# Patient Record
Sex: Female | Born: 2000 | Hispanic: Yes | Marital: Single | Smoking: Never smoker
Health system: Southern US, Community
[De-identification: ages and names within clinical notes are randomized; demographics above are authoritative.]

---
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
## 2021-03-03 IMAGING — US US OB < 14 WEEKS - US OB TV
1 series · 13 of 28 positions shown · non-contrast
Comparison: None.

CLINICAL DATA: Vaginal bleeding, positive urine pregnancy test

EXAM:
OBSTETRIC <14 WK US AND TRANSVAGINAL OB US
TECHNIQUE: Both transabdominal and transvaginal ultrasound examinations were
performed for complete evaluation of the gestation as well as the
maternal uterus, adnexal regions, and pelvic cul-de-sac.
Transvaginal technique was performed to assess early pregnancy.

[Series 1: us ob less than 14 weeks with ob transvaginal · 13 of 135 slices shown]
[im 5/135]
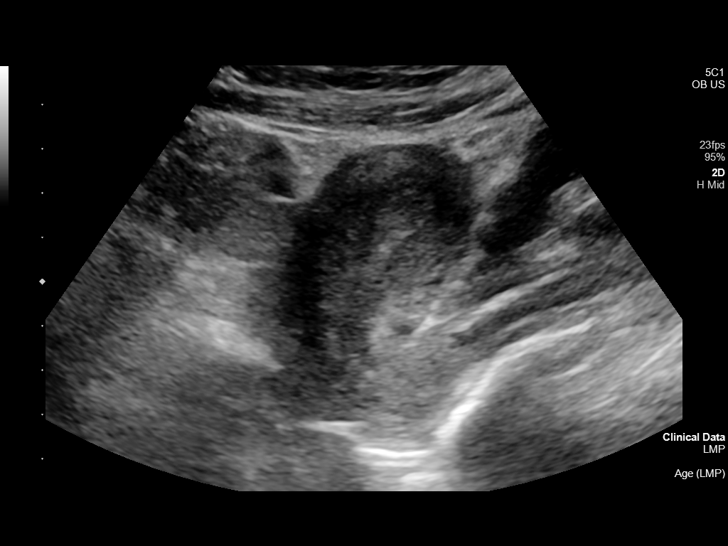
[im 15/135]
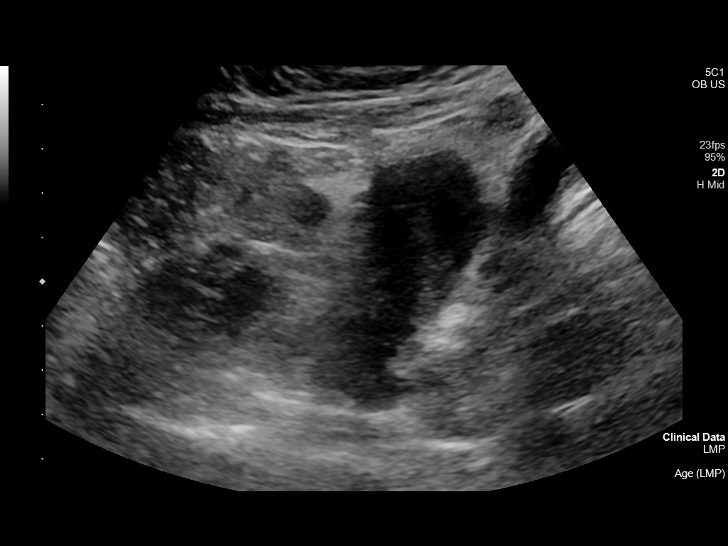
[im 25/135]
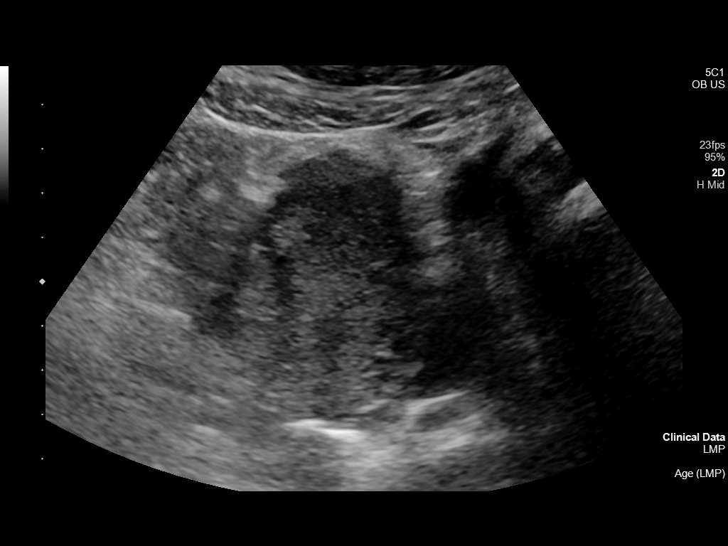
[im 35/135]
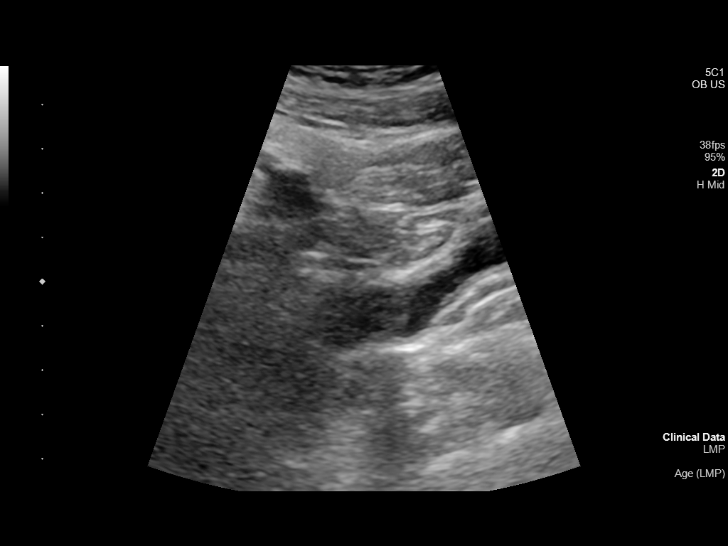
[im 45/135]
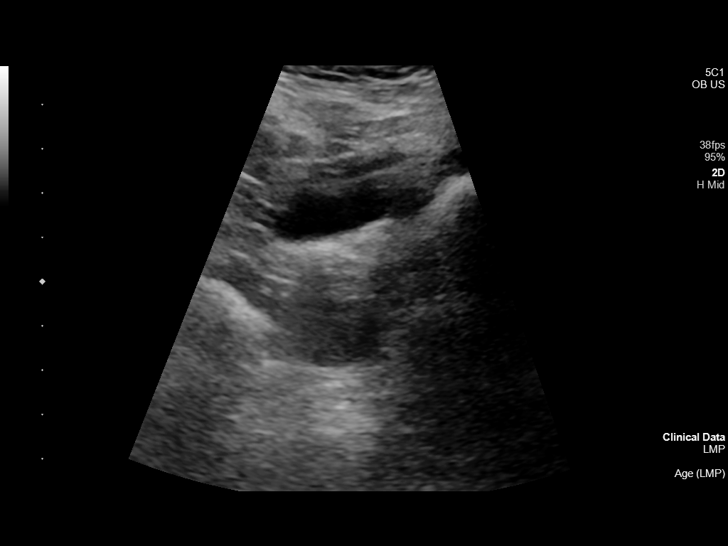
[im 55/135]
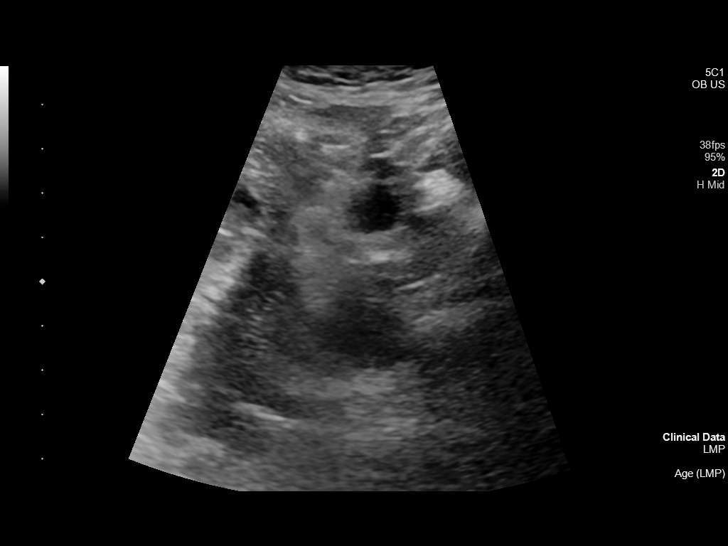
[im 70/135]
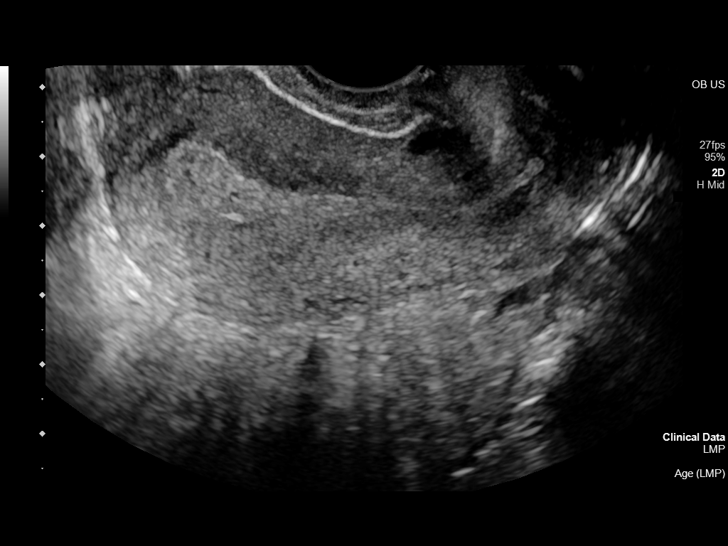
[im 80/135]
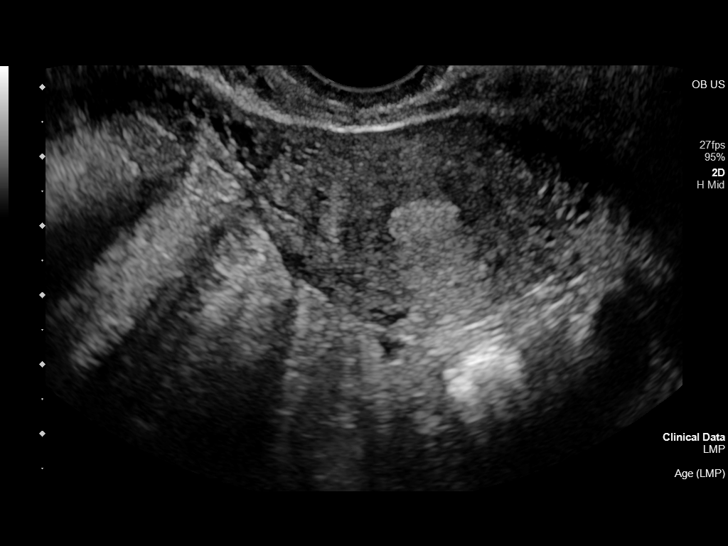
[im 90/135]
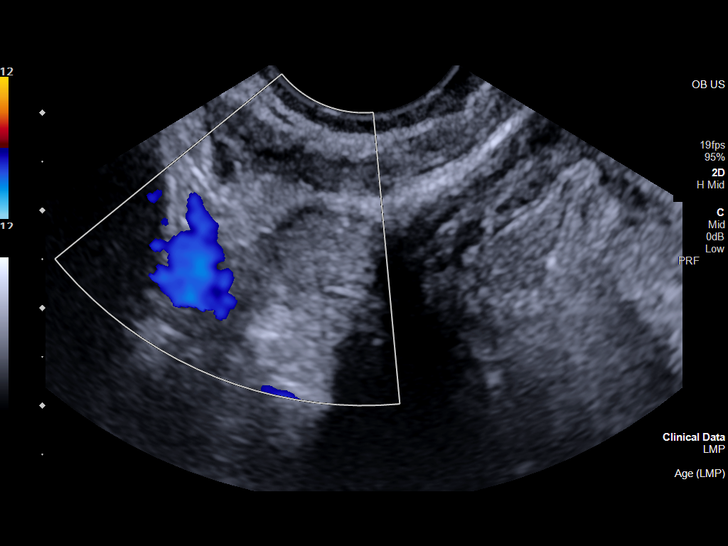
[im 100/135]
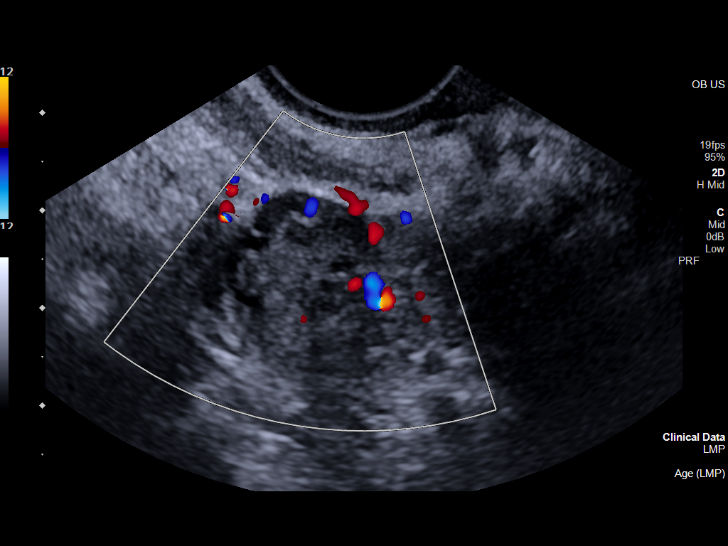
[im 110/135]
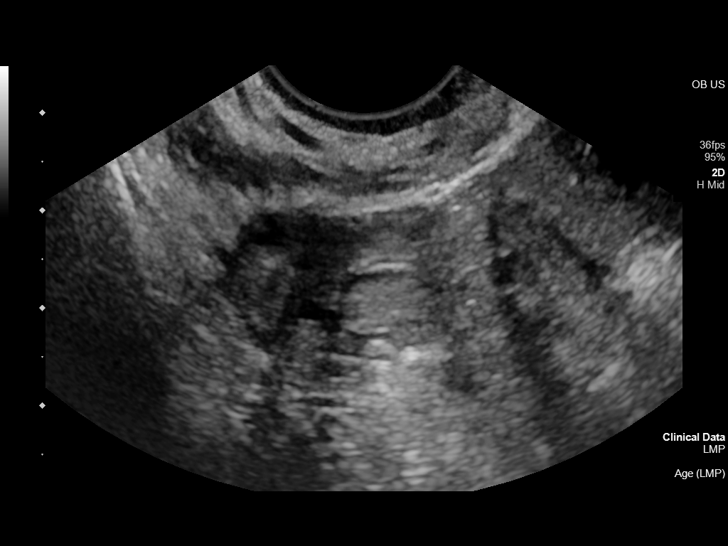
[im 120/135]
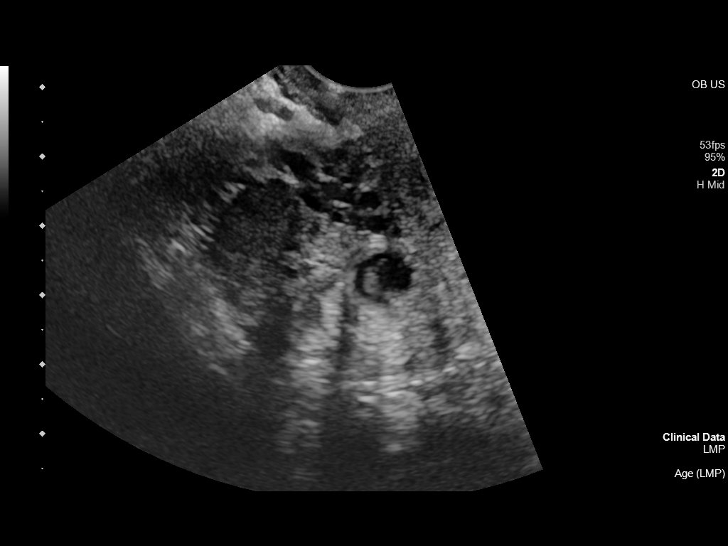
[im 130/135]
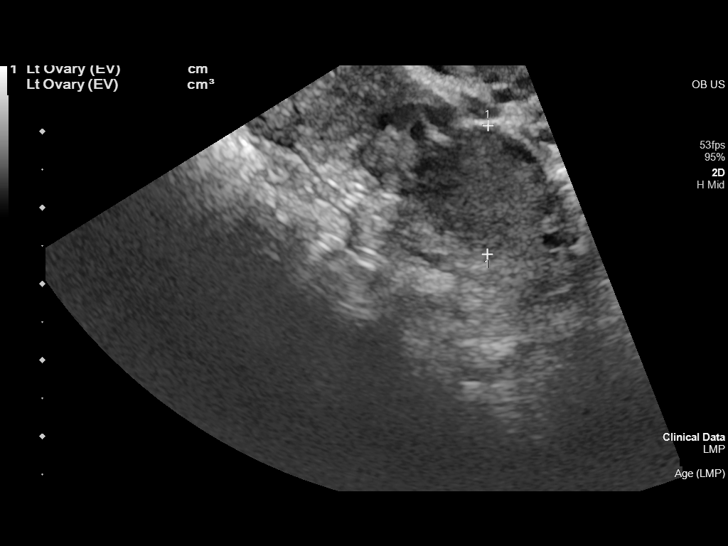

[13 of 28 positions shown; findings below may reference images not displayed]

FINDINGS: Intrauterine gestational sac: None identified

Yolk sac:  Not applicable

Embryo:  Not applicable

Cardiac Activity: Not applicable

Heart Rate:   Not applicable

MSD:   Not applicable

CRL:    Not applicable

Subchorionic hemorrhage:  None visualized.

Maternal uterus/adnexae: The uterus is anteverted. The uterus
measures 7.0 x 3.6 x 4.8 cm in greatest dimension. The endometrial
stripe is uniform, measuring 10 mm in thickness. A trace amount of
fluid is seen within the endometrial cavity. No intrauterine masses
are seen. The cervix is closed and is unremarkable. There is no free
fluid seen within the cul-de-sac. The ovaries are normal in size and
echogenicity. Both ovaries demonstrate normal color flow
vascularity. Several follicles are seen within the right ovary. No
adnexal masses are seen.
IMPRESSION: Pregnancy of unknown location as no intrauterine gestational sac is
identified and no definite extra uterine gestational sac or
secondary signs of extra uterine implantation are identified.
Correlation with quantitative beta HCG value may be helpful as this
may relate to extremely early gestation. Follow-up sonography in
10-14 days would be helpful in documenting appropriate progression
and intrauterine implantation.
